# Patient Record
Sex: Male | Born: 1955 | Race: Black or African American | Hispanic: No | Marital: Single | Smoking: Current some day smoker
Health system: Southern US, Community
[De-identification: ages and names within clinical notes are randomized; demographics above are authoritative.]

## PROBLEM LIST (undated history)

## (undated) DIAGNOSIS — E785 Hyperlipidemia, unspecified: Secondary | ICD-10-CM

## (undated) DIAGNOSIS — I10 Essential (primary) hypertension: Secondary | ICD-10-CM

## (undated) DIAGNOSIS — E119 Type 2 diabetes mellitus without complications: Secondary | ICD-10-CM

## (undated) DIAGNOSIS — J45909 Unspecified asthma, uncomplicated: Secondary | ICD-10-CM

## (undated) HISTORY — PX: NECK SURGERY: SHX720

## (undated) HISTORY — DX: Hyperlipidemia, unspecified: E78.5

## (undated) HISTORY — DX: Unspecified asthma, uncomplicated: J45.909

---

## 2010-08-13 ENCOUNTER — Encounter: Payer: Self-pay | Admitting: Emergency Medicine

## 2010-08-13 ENCOUNTER — Ambulatory Visit: Payer: Self-pay | Admitting: Diagnostic Radiology

## 2010-08-14 ENCOUNTER — Inpatient Hospital Stay (HOSPITAL_COMMUNITY): Admission: EM | Admit: 2010-08-14 | Discharge: 2010-08-15 | Payer: Self-pay | Admitting: Family Medicine

## 2010-08-14 ENCOUNTER — Ambulatory Visit: Payer: Self-pay | Admitting: Cardiology

## 2010-08-15 ENCOUNTER — Ambulatory Visit: Payer: Self-pay | Admitting: Vascular Surgery

## 2010-08-15 ENCOUNTER — Encounter (INDEPENDENT_AMBULATORY_CARE_PROVIDER_SITE_OTHER): Payer: Self-pay | Admitting: Internal Medicine

## 2010-08-24 ENCOUNTER — Emergency Department (HOSPITAL_BASED_OUTPATIENT_CLINIC_OR_DEPARTMENT_OTHER): Admission: EM | Admit: 2010-08-24 | Discharge: 2010-08-24 | Payer: Self-pay | Admitting: Emergency Medicine

## 2010-12-30 LAB — CBC
HCT: 39.3 % (ref 39.0–52.0)
Hemoglobin: 13.4 g/dL (ref 13.0–17.0)
MCHC: 34.2 g/dL (ref 30.0–36.0)
MCV: 83.1 fL (ref 78.0–100.0)
Platelets: 206 10*3/uL (ref 150–400)
Platelets: 235 10*3/uL (ref 150–400)
RBC: 4.56 MIL/uL (ref 4.22–5.81)
RBC: 4.85 MIL/uL (ref 4.22–5.81)
RDW: 12.8 % (ref 11.5–15.5)
WBC: 12.5 10*3/uL — ABNORMAL HIGH (ref 4.0–10.5)
WBC: 7.2 10*3/uL (ref 4.0–10.5)

## 2010-12-30 LAB — LIPID PANEL
Cholesterol: 193 mg/dL (ref 0–200)
LDL Cholesterol: 101 mg/dL — ABNORMAL HIGH (ref 0–99)
Triglycerides: 80 mg/dL (ref <150)
VLDL: 16 mg/dL (ref 0–40)

## 2010-12-30 LAB — BASIC METABOLIC PANEL
BUN: 6 mg/dL (ref 6–23)
Creatinine, Ser: 0.92 mg/dL (ref 0.4–1.5)
GFR calc non Af Amer: >60 mL/min (ref >60)

## 2010-12-30 LAB — URINALYSIS, ROUTINE W REFLEX MICROSCOPIC
Protein, ur: NEGATIVE mg/dL
Urobilinogen, UA: 0.2 mg/dL (ref 0.0–1.0)

## 2010-12-30 LAB — COMPREHENSIVE METABOLIC PANEL
ALT: 18 U/L (ref 0–53)
Alkaline Phosphatase: 78 U/L (ref 39–117)
CO2: 26 mEq/L (ref 19–32)
Calcium: 8.9 mg/dL (ref 8.4–10.5)
GFR calc non Af Amer: >60 mL/min (ref >60)
Glucose, Bld: 146 mg/dL — ABNORMAL HIGH (ref 70–99)
Potassium: 4.1 mEq/L (ref 3.5–5.1)
Sodium: 146 mEq/L — ABNORMAL HIGH (ref 135–145)

## 2010-12-30 LAB — GLUCOSE, CAPILLARY
Glucose-Capillary: 131 mg/dL — ABNORMAL HIGH (ref 70–99)
Glucose-Capillary: 132 mg/dL — ABNORMAL HIGH (ref 70–99)
Glucose-Capillary: 136 mg/dL — ABNORMAL HIGH (ref 70–99)
Glucose-Capillary: 141 mg/dL — ABNORMAL HIGH (ref 70–99)
Glucose-Capillary: 155 mg/dL — ABNORMAL HIGH (ref 70–99)
Glucose-Capillary: 157 mg/dL — ABNORMAL HIGH (ref 70–99)
Glucose-Capillary: 82 mg/dL (ref 70–99)

## 2010-12-30 LAB — CARDIAC PANEL(CRET KIN+CKTOT+MB+TROPI)
CK, MB: 1.2 ng/mL (ref 0.3–4.0)
CK, MB: 1.4 ng/mL (ref 0.3–4.0)
Relative Index: 0.4 (ref 0.0–2.5)
Total CK: 275 U/L — ABNORMAL HIGH (ref 7–232)
Total CK: 311 U/L — ABNORMAL HIGH (ref 7–232)
Troponin I: <0.01 ng/mL (ref 0.00–0.06)
Troponin I: <0.01 ng/mL (ref 0.00–0.06)

## 2010-12-30 LAB — DIFFERENTIAL
Basophils Relative: 1 % (ref 0–1)
Eosinophils Absolute: 0.2 10*3/uL (ref 0.0–0.7)
Neutrophils Relative %: 58 % (ref 43–77)

## 2010-12-30 LAB — ETHANOL: Alcohol, Ethyl (B): 128 mg/dL — ABNORMAL HIGH (ref 0–10)

## 2013-10-04 ENCOUNTER — Encounter (HOSPITAL_BASED_OUTPATIENT_CLINIC_OR_DEPARTMENT_OTHER): Payer: Self-pay | Admitting: Emergency Medicine

## 2013-10-04 ENCOUNTER — Emergency Department (HOSPITAL_BASED_OUTPATIENT_CLINIC_OR_DEPARTMENT_OTHER)
Admission: EM | Admit: 2013-10-04 | Discharge: 2013-10-04 | Disposition: A | Discharge status: Home / Self Care | Payer: Self-pay | Attending: Emergency Medicine | Admitting: Emergency Medicine

## 2013-10-04 ENCOUNTER — Emergency Department (HOSPITAL_BASED_OUTPATIENT_CLINIC_OR_DEPARTMENT_OTHER): Payer: Self-pay

## 2013-10-04 DIAGNOSIS — M2559 Pain in other specified joint: Secondary | ICD-10-CM | POA: Insufficient documentation

## 2013-10-04 DIAGNOSIS — J209 Acute bronchitis, unspecified: Secondary | ICD-10-CM | POA: Insufficient documentation

## 2013-10-04 DIAGNOSIS — R630 Anorexia: Secondary | ICD-10-CM | POA: Insufficient documentation

## 2013-10-04 DIAGNOSIS — E119 Type 2 diabetes mellitus without complications: Secondary | ICD-10-CM | POA: Insufficient documentation

## 2013-10-04 DIAGNOSIS — Z794 Long term (current) use of insulin: Secondary | ICD-10-CM | POA: Insufficient documentation

## 2013-10-04 DIAGNOSIS — F17200 Nicotine dependence, unspecified, uncomplicated: Secondary | ICD-10-CM | POA: Insufficient documentation

## 2013-10-04 DIAGNOSIS — IMO0001 Reserved for inherently not codable concepts without codable children: Secondary | ICD-10-CM | POA: Insufficient documentation

## 2013-10-04 DIAGNOSIS — J40 Bronchitis, not specified as acute or chronic: Secondary | ICD-10-CM

## 2013-10-04 DIAGNOSIS — Z79899 Other long term (current) drug therapy: Secondary | ICD-10-CM | POA: Insufficient documentation

## 2013-10-04 DIAGNOSIS — I10 Essential (primary) hypertension: Secondary | ICD-10-CM | POA: Insufficient documentation

## 2013-10-04 DIAGNOSIS — K6289 Other specified diseases of anus and rectum: Secondary | ICD-10-CM | POA: Insufficient documentation

## 2013-10-04 DIAGNOSIS — R5381 Other malaise: Secondary | ICD-10-CM | POA: Insufficient documentation

## 2013-10-04 HISTORY — DX: Type 2 diabetes mellitus without complications: E11.9

## 2013-10-04 HISTORY — DX: Essential (primary) hypertension: I10

## 2013-10-04 LAB — COMPREHENSIVE METABOLIC PANEL
ALT: 12 U/L (ref 0–53)
Albumin: 3.6 g/dL (ref 3.5–5.2)
Alkaline Phosphatase: 67 U/L (ref 39–117)
BUN: 7 mg/dL (ref 6–23)
Chloride: 103 mEq/L (ref 96–112)
GFR calc Af Amer: >90 mL/min (ref >90)
Glucose, Bld: 187 mg/dL — ABNORMAL HIGH (ref 70–99)
Potassium: 3.6 mEq/L (ref 3.5–5.1)
Sodium: 139 mEq/L (ref 135–145)
Total Bilirubin: 0.4 mg/dL (ref 0.3–1.2)
Total Protein: 7.3 g/dL (ref 6.0–8.3)

## 2013-10-04 LAB — CBC WITH DIFFERENTIAL/PLATELET
Eosinophils Absolute: 0.1 10*3/uL (ref 0.0–0.7)
Hemoglobin: 12.7 g/dL — ABNORMAL LOW (ref 13.0–17.0)
Lymphs Abs: 1 10*3/uL (ref 0.7–4.0)
MCH: 28.7 pg (ref 26.0–34.0)
Monocytes Relative: 10 % (ref 3–12)
Neutro Abs: 4.4 10*3/uL (ref 1.7–7.7)
Neutrophils Relative %: 71 % (ref 43–77)
Platelets: 200 10*3/uL (ref 150–400)
RBC: 4.42 MIL/uL (ref 4.22–5.81)
WBC: 6.2 10*3/uL (ref 4.0–10.5)

## 2013-10-04 LAB — OCCULT BLOOD X 1 CARD TO LAB, STOOL: Fecal Occult Bld: NEGATIVE

## 2013-10-04 LAB — TROPONIN I: Troponin I: <0.3 ng/mL (ref <0.30)

## 2013-10-04 LAB — RAPID STREP SCREEN (MED CTR MEBANE ONLY): Streptococcus, Group A Screen (Direct): NEGATIVE

## 2013-10-04 MED ORDER — AZITHROMYCIN 250 MG PO TABS: 250.0000 mg | ORAL_TABLET | Freq: Every day | ORAL | Status: DC

## 2013-10-04 MED ORDER — ALBUTEROL SULFATE HFA 108 (90 BASE) MCG/ACT IN AERS: 1.0000 | INHALATION_SPRAY | Freq: Four times a day (QID) | RESPIRATORY_TRACT | Status: AC | PRN

## 2013-10-04 NOTE — ED Notes (Signed)
Pt. Ambulated to rest room with no distress and no trouble.

## 2013-10-04 NOTE — ED Notes (Signed)
Pt amb to triage with quick steady gait in nad. Pt reports cough, rectal pain when he coughs, and congestion x Sunday.

## 2013-10-04 NOTE — ED Provider Notes (Signed)
CSN: [id number]     Arrival date & time 10/04/13  1723 History   First MD Initiated Contact with Patient 10/04/13 1752     Chief Complaint  Patient presents with  . Sore Throat  . Rectal Pain  . Cough   (Consider location/radiation/quality/duration/timing/severity/associated sxs/prior Treatment) HPI Comments: Patient claims a four-day history of sore throat, productive cough, shortness of breath, pain in his rectum with coughing. Coughing productive of yellow-green mucus. He is a smoker. He is a diabetic and has a history of hypertension. Denies any cardiac or pulmonary problems. He did receive a flu shot. Denies any headache, chest pain, abdominal pain or back pain. No vomiting. He has had sick contacts at home. Denies any blood in his stool.  The history is provided by the patient.    Past Medical History  Diagnosis Date  . Diabetes mellitus without complication   . Hypertension    History reviewed. No pertinent past surgical history. History reviewed. No pertinent family history. History  Substance Use Topics  . Smoking status: Current Every Day Smoker  . Smokeless tobacco: Not on file  . Alcohol Use: Not on file    Review of Systems  Constitutional: Positive for activity change, appetite change and fatigue. Negative for fever.  HENT: Positive for congestion and rhinorrhea.   Respiratory: Positive for cough and shortness of breath. Negative for chest tightness.   Cardiovascular: Negative for chest pain.  Gastrointestinal: Negative for nausea, vomiting and abdominal pain.  Genitourinary: Negative for dysuria and hematuria.  Musculoskeletal: Positive for arthralgias and myalgias. Negative for back pain.  Skin: Negative for rash.  Neurological: Negative for dizziness, weakness and headaches.  A complete 10 system review of systems was obtained and all systems are negative except as noted in the HPI and PMH.    Allergies  Sulfa antibiotics  Home Medications   Current  Outpatient Rx  Name  Route  Sig  Dispense  Refill  . ALBUTEROL IN   Inhalation   Inhale into the lungs.         Marland Kitchen GABAPENTIN, PHN, PO   Oral   Take by mouth.         . insulin glargine (LANTUS) 100 UNIT/ML injection   Subcutaneous   Inject 80 Units into the skin at bedtime.         Marland Kitchen omeprazole (PRILOSEC) 20 MG capsule   Oral   Take 20 mg by mouth daily.         . Rosuvastatin Calcium (CRESTOR PO)   Oral   Take by mouth.         . sertraline (ZOLOFT) 50 MG tablet   Oral   Take 50 mg by mouth daily.         . tamsulosin (FLOMAX) 0.4 MG CAPS capsule   Oral   Take 0.4 mg by mouth.         . TRAZODONE HCL PO   Oral   Take by mouth.         Marland Kitchen albuterol (PROVENTIL HFA;VENTOLIN HFA) 108 (90 BASE) MCG/ACT inhaler   Inhalation   Inhale 1-2 puffs into the lungs every 6 (six) hours as needed for wheezing or shortness of breath.   1 Inhaler   0   . azithromycin (ZITHROMAX) 250 MG tablet   Oral   Take 1 tablet (250 mg total) by mouth daily. Take first 2 tablets together, then 1 every day until finished.   6 tablet   0  BP 131/76  Pulse 90  Temp(Src) 98.4 F (36.9 C) (Oral)  Resp 18  SpO2 100% Physical Exam  Constitutional: He is oriented to person, place, and time. He appears well-developed and well-nourished. No distress.  HENT:  Head: Normocephalic and atraumatic.  Mouth/Throat: Oropharynx is clear and moist. No oropharyngeal exudate.  Eyes: Conjunctivae and EOM are normal. Pupils are equal, round, and reactive to light.  Neck: Normal range of motion. Neck supple.  No meningismus Well healed surgical scar  Cardiovascular: Normal rate, regular rhythm and normal heart sounds.   No murmur heard. Pulmonary/Chest: Effort normal and breath sounds normal. No respiratory distress.  Abdominal: Soft. There is no tenderness. There is no rebound and no guarding.  Genitourinary: Guaiac negative stool.  Musculoskeletal: Normal range of motion. He exhibits  no edema and no tenderness.  Neurological: He is alert and oriented to person, place, and time. No cranial nerve deficit. He exhibits normal muscle tone. Coordination normal.  Skin: Skin is warm.    ED Course  Procedures (including critical care time) Labs Review Labs Reviewed  CBC WITH DIFFERENTIAL - Abnormal; Notable for the following:    Hemoglobin 12.7 (*)    HCT 38.0 (*)    All other components within normal limits  COMPREHENSIVE METABOLIC PANEL - Abnormal; Notable for the following:    Glucose, Bld 187 (*)    All other components within normal limits  RAPID STREP SCREEN  CULTURE, GROUP A STREP  TROPONIN I  PRO B NATRIURETIC PEPTIDE  OCCULT BLOOD X 1 CARD TO LAB, STOOL   Imaging Review Dg Chest 2 View  10/04/2013   CLINICAL DATA:  Cough.  Shortness of breath.  Chest congestion.  EXAM: CHEST  2 VIEW  COMPARISON:  04/20/2011  FINDINGS: The heart size and mediastinal contours are within normal limits. Both lungs are clear. The visualized skeletal structures are unremarkable.  IMPRESSION: No active cardiopulmonary disease.   Electronically Signed   By: Myles Rosenthal M.D.   On: 10/04/2013 18:22   Dg Abd 1 View  10/04/2013   CLINICAL DATA:  Rectal pain when coughing.  EXAM: ABDOMEN - 1 VIEW  COMPARISON:  None.  FINDINGS: There are no dilated loops of large or small bowel. Phleboliths are seen in the pelvis. No osseous abnormality.  IMPRESSION: Benign appearing abdomen.   Electronically Signed   By: Geanie Cooley M.D.   On: 10/04/2013 18:20    EKG Interpretation    Date/Time:  Thursday October 04 2013 18:23:00 EST Ventricular Rate:  90 PR Interval:  162 QRS Duration: 90 QT Interval:  366 QTC Calculation: 447 R Axis:   36 Text Interpretation:  Normal sinus rhythm Normal ECG No significant change was found Confirmed by Manus Gunning  MD, Laretta Pyatt ([id number]) on 10/04/2013 6:28:31 PM            MDM   1. Bronchitis    3 day history of productive cough and congestion. No chest pain  or shortness of breath.  Abdomen is soft without peritoneal signs.  Hemoglobin stable. Guaiac negative.  X-rays negative for acute infiltrate. Patient in no distress with no hypoxia or tachycardia. Patient instructed that he needs to stop smoking. Given patient's history of smoking and diabetes, we'll treat for bronchitis. He is not wheezing on exam. He is stable for followup with his PCP.  Glynn Octave, MD 10/04/13 [phone]

## 2013-10-04 NOTE — ED Notes (Signed)
Pt. Goes on and on  With different complaints and different things that have happened in the past.

## 2013-10-06 LAB — CULTURE, GROUP A STREP

## 2014-05-08 ENCOUNTER — Institutional Professional Consult (permissible substitution): Payer: Self-pay | Admitting: Cardiology

## 2014-07-31 ENCOUNTER — Institutional Professional Consult (permissible substitution): Payer: Self-pay | Admitting: Cardiology

## 2014-08-21 ENCOUNTER — Encounter: Payer: Self-pay | Admitting: Cardiology

## 2014-08-21 NOTE — Progress Notes (Signed)
     HPI: 58 year old male for evaluation of syncope. Echocardiogram in October 2011 showed normal LV function, mild left atrial enlargement and mildly dilated aortic root. Had a syncopal episode in October 2011 attributed to alcohol.  Current Outpatient Prescriptions  Medication Sig Dispense Refill  . albuterol (PROVENTIL HFA;VENTOLIN HFA) 108 (90 BASE) MCG/ACT inhaler Inhale 1-2 puffs into the lungs every 6 (six) hours as needed for wheezing or shortness of breath. 1 Inhaler 0  . ALBUTEROL IN Inhale into the lungs.    Marland Kitchen. azithromycin (ZITHROMAX) 250 MG tablet Take 1 tablet (250 mg total) by mouth daily. Take first 2 tablets together, then 1 every day until finished. 6 tablet 0  . GABAPENTIN, PHN, PO Take by mouth.    . insulin glargine (LANTUS) 100 UNIT/ML injection Inject 80 Units into the skin at bedtime.    Marland Kitchen. omeprazole (PRILOSEC) 20 MG capsule Take 20 mg by mouth daily.    . Rosuvastatin Calcium (CRESTOR PO) Take by mouth.    . sertraline (ZOLOFT) 50 MG tablet Take 50 mg by mouth daily.    . tamsulosin (FLOMAX) 0.4 MG CAPS capsule Take 0.4 mg by mouth.    . TRAZODONE HCL PO Take by mouth.     No current facility-administered medications for this visit.    Allergies  Allergen Reactions  . Sulfa Antibiotics     Past Medical History  Diagnosis Date  . Diabetes mellitus without complication   . Hypertension     No past surgical history on file.  History   Social History  . Marital Status: Single    Spouse Name: N/A    Number of Children: N/A  . Years of Education: N/A   Occupational History  . Not on file.   Social History Main Topics  . Smoking status: Current Every Day Smoker  . Smokeless tobacco: Not on file  . Alcohol Use: Not on file  . Drug Use: Not on file  . Sexual Activity: Not on file   Other Topics Concern  . Not on file   Social History Narrative  . No narrative on file    No family history on file.  ROS: no fevers or chills, productive cough,  hemoptysis, dysphasia, odynophagia, melena, hematochezia, dysuria, hematuria, rash, seizure activity, orthopnea, PND, pedal edema, claudication. Remaining systems are negative.  Physical Exam:   There were no vitals taken for this visit.  General:  Well developed/well nourished in NAD Skin warm/dry Patient not depressed No peripheral clubbing Back-normal HEENT-normal/normal eyelids Neck supple/normal carotid upstroke bilaterally; no bruits; no JVD; no thyromegaly chest - CTA/ normal expansion CV - RRR/normal S1 and S2; no murmurs, rubs or gallops;  PMI nondisplaced Abdomen -NT/ND, no HSM, no mass, + bowel sounds, no bruit 2+ femoral pulses, no bruits Ext-no edema, chords, 2+ DP Neuro-grossly nonfocal  ECG    This encounter was created in error - please disregard.

## 2014-11-05 ENCOUNTER — Telehealth: Payer: Self-pay | Admitting: Cardiology

## 2014-11-05 NOTE — Telephone Encounter (Signed)
Closed encounter °

## 2014-11-13 ENCOUNTER — Ambulatory Visit (INDEPENDENT_AMBULATORY_CARE_PROVIDER_SITE_OTHER): Payer: Self-pay | Admitting: Cardiology

## 2014-11-13 ENCOUNTER — Encounter: Payer: Self-pay | Admitting: Cardiology

## 2014-11-13 ENCOUNTER — Encounter: Payer: Self-pay | Admitting: *Deleted

## 2014-11-13 VITALS — BP 112/72 | HR 84 | Ht 77.0 in | Wt 254.1 lb

## 2014-11-13 DIAGNOSIS — Z720 Tobacco use: Secondary | ICD-10-CM

## 2014-11-13 DIAGNOSIS — R079 Chest pain, unspecified: Secondary | ICD-10-CM | POA: Insufficient documentation

## 2014-11-13 DIAGNOSIS — I10 Essential (primary) hypertension: Secondary | ICD-10-CM

## 2014-11-13 DIAGNOSIS — R072 Precordial pain: Secondary | ICD-10-CM

## 2014-11-13 DIAGNOSIS — R55 Syncope and collapse: Secondary | ICD-10-CM

## 2014-11-13 NOTE — Assessment & Plan Note (Signed)
Patient counseled on discontinuing. 

## 2014-11-13 NOTE — Assessment & Plan Note (Signed)
Etiology unclear. He does have some orthostatic symptoms although event not clearly orthostatic by history. He was seen at [facility] and records are not available. Electrocardiogram is normal. No recurrent events in the past 1 year. Will arrange echocardiogram to assess LV function and if normal no further workup unless has recurrent events.

## 2014-11-13 NOTE — Assessment & Plan Note (Signed)
Symptoms are atypical. However given long history of diabetes mellitus I think it is worthwhile to proceed with an exercise treadmill for risk stratification.

## 2014-11-13 NOTE — Patient Instructions (Signed)
Your physician recommends that you schedule a follow-up appointment in: AS NEEDED PENDING TEST RESULTS  Your physician has requested that you have an echocardiogram. Echocardiography is a painless test that uses sound waves to create images of your heart. It provides your doctor with information about the size and shape of your heart and how well your heart's chambers and valves are working. This procedure takes approximately one hour. There are no restrictions for this procedure.   Your physician has requested that you have an exercise tolerance test. For further information please visit [email or web address]. Please also follow instruction sheet, as given.    Exercise Stress Electrocardiogram An exercise stress electrocardiogram is a test to check how blood flows to your heart. It is done to find areas of poor blood flow. You will need to walk on a treadmill for this test. The electrocardiogram will record your heartbeat when you are at rest and when you are exercising. BEFORE THE PROCEDURE  Do not have drinks with caffeine or foods with caffeine for 24 hours before the test, or as told by your doctor. This includes coffee, tea (even decaf tea), sodas, chocolate, and cocoa.  Follow your doctor's instructions about eating and drinking before the test.  Ask your doctor what medicines you should or should not take before the test. Take your medicines with water unless told by your doctor not to.  If you use an inhaler, bring it with you to the test.  Bring a snack to eat after the test.  Do not  smoke for 4 hours before the test.  Do not put lotions, powders, creams, or oils on your chest before the test.  Wear comfortable shoes and clothing. PROCEDURE  You will have patches put on your chest. Small areas of your chest may need to be shaved. Wires will be connected to the patches.  Your heart rate will be watched while you are resting and while you are exercising.  You will walk on the  treadmill. The treadmill will slowly get faster to raise your heart rate.  The test will take about 1-2 hours. AFTER THE PROCEDURE  Your heart rate and blood pressure will be watched after the test.  You may return to your normal diet, activities, and medicines or as told by your doctor. Document Released: 03/22/2008 Document Revised: 02/18/2014 Document Reviewed: 06/11/2013 [employer] Patient Information 2015 [employer]. This information is not intended to replace advice given to you by your health care provider. Make sure you discuss any questions you have with your health care provider.   

## 2014-11-13 NOTE — Progress Notes (Signed)
HPI: 59 year old male for evaluation of syncope. Echocardiogram February 2015 showed normal LV function and mild left ventricular hypertrophy. Patient had a syncopal episode approximately 1 year ago. He took his medicines and then was unconscious for 3 minutes. He apparently was seen at [facility] but I do not have those records available. No preceding chest pain or palpitations. He does note some dyspnea on exertion but no orthopnea or PND. He describes occasional pedal edema. He also has occasional chest pain that is substernal without radiation. It increases with cough and inspiration and lying flat. No exertional symptoms. Typically last 3-4 minutes.  Current Outpatient Prescriptions  Medication Sig Dispense Refill  . albuterol (PROVENTIL HFA;VENTOLIN HFA) 108 (90 BASE) MCG/ACT inhaler Inhale 1-2 puffs into the lungs every 6 (six) hours as needed for wheezing or shortness of breath. 1 Inhaler 0  . ASPIR-LOW 81 MG EC tablet Take 81 mg by mouth daily.  3  . CRESTOR 20 MG tablet Take 20 mg by mouth daily.  5  . esomeprazole (NEXIUM) 40 MG capsule Take 40 mg by mouth daily at 12 noon.    . gabapentin (NEURONTIN) 300 MG capsule Take 600 mg by mouth 2 (two) times daily.  3  . glipiZIDE (GLUCOTROL) 5 MG tablet Take 5 mg by mouth daily before breakfast.    . insulin glargine (LANTUS) 100 UNIT/ML injection Inject 80 Units into the skin at bedtime.    Marland Kitchen. lisinopril (PRINIVIL,ZESTRIL) 2.5 MG tablet Take 2.5 mg by mouth daily.    . metFORMIN (GLUCOPHAGE) 1000 MG tablet Take 1,000 mg by mouth 2 (two) times daily with a meal.   5  . sertraline (ZOLOFT) 50 MG tablet Take 100 mg by mouth daily.     . silodosin (RAPAFLO) 8 MG CAPS capsule Take 8 mg by mouth daily with breakfast.    . tamsulosin (FLOMAX) 0.4 MG CAPS capsule Take 0.4 mg by mouth.    . traZODone (DESYREL) 50 MG tablet Take 50 mg by mouth at bedtime.  2   No current facility-administered medications for this visit.    Allergies    Allergen Reactions  . Sulfa Antibiotics      Past Medical History  Diagnosis Date  . Diabetes mellitus without complication   . Hypertension   . Hyperlipidemia   . Asthma     Past Surgical History  Procedure Laterality Date  . Neck surgery      History   Social History  . Marital Status: Single    Spouse Name: N/A    Number of Children: 1  . Years of Education: N/A   Occupational History  . Not on file.   Social History Main Topics  . Smoking status: Current Some Day Smoker  . Smokeless tobacco: Not on file  . Alcohol Use: 0.0 oz/week    0 Not specified per week     Comment: Occasional  . Drug Use: No  . Sexual Activity: Not on file   Other Topics Concern  . Not on file   Social History Narrative    Family History  Problem Relation Age of Onset  . Heart disease      No family history    ROS: some subjective fever but no productive cough, hemoptysis, dysphasia, odynophagia, melena, hematochezia, dysuria, hematuria, rash, seizure activity, orthopnea, PND, claudication. Remaining systems are negative.  Physical Exam:   Blood pressure 112/72, pulse 84, height 6\' 5"  (1.956 m), weight 254 lb 1.3 oz (115.25  kg).  General:  Well developed/well nourished in NAD Skin warm/dry Patient not depressed No peripheral clubbing Back-normal HEENT-normal/normal eyelids Neck supple/normal carotid upstroke bilaterally; no bruits; no JVD; no thyromegaly chest - CTA/ normal expansion CV - RRR/normal S1 and S2; no murmurs, rubs or gallops;  PMI nondisplaced Abdomen -NT/ND, no HSM, no mass, + bowel sounds, no bruit 2+ femoral pulses, no bruits Ext-no edema, chords, 2+ DP Neuro-grossly nonfocal  ECG NSR with no ST changes

## 2014-11-13 NOTE — Assessment & Plan Note (Signed)
Blood pressure controlled. Continue present medications. 

## 2014-11-29 ENCOUNTER — Telehealth (HOSPITAL_COMMUNITY): Payer: Self-pay

## 2014-11-29 NOTE — Telephone Encounter (Signed)
Encounter complete. 

## 2014-12-02 ENCOUNTER — Telehealth (HOSPITAL_COMMUNITY): Payer: Self-pay | Admitting: *Deleted

## 2014-12-04 ENCOUNTER — Encounter (HOSPITAL_COMMUNITY): Payer: Self-pay

## 2014-12-04 ENCOUNTER — Inpatient Hospital Stay (HOSPITAL_COMMUNITY): Admission: RE | Admit: 2014-12-04 | Payer: Self-pay | Source: Ambulatory Visit

## 2015-06-01 IMAGING — CR DG ABDOMEN 1V
2 series · 2 of 2 positions shown · non-contrast
Comparison: None.

CLINICAL DATA: Rectal pain when coughing.

EXAM:
ABDOMEN - 1 VIEW

[t abdomen supine (1 of 2)]
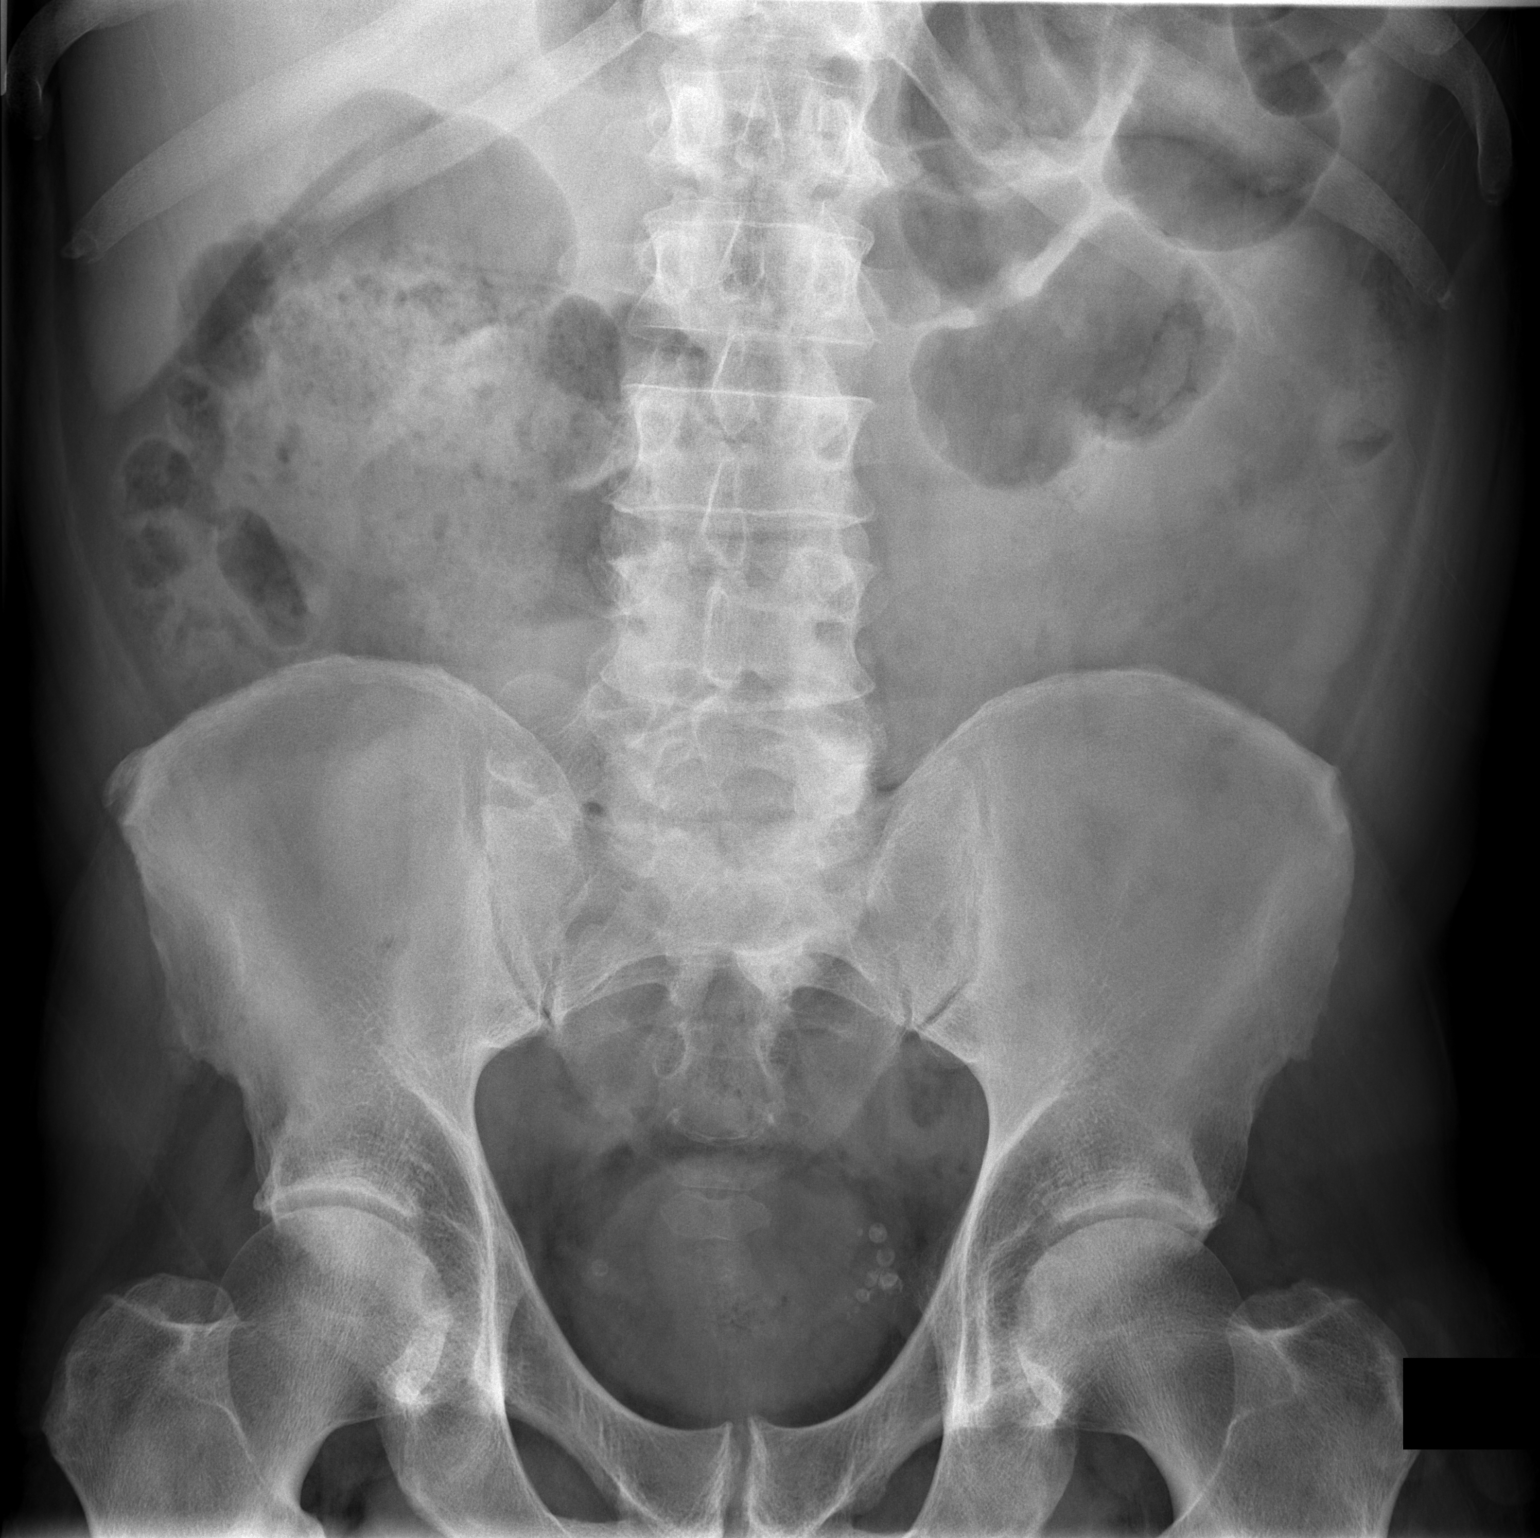

[t abdomen supine (2 of 2)]
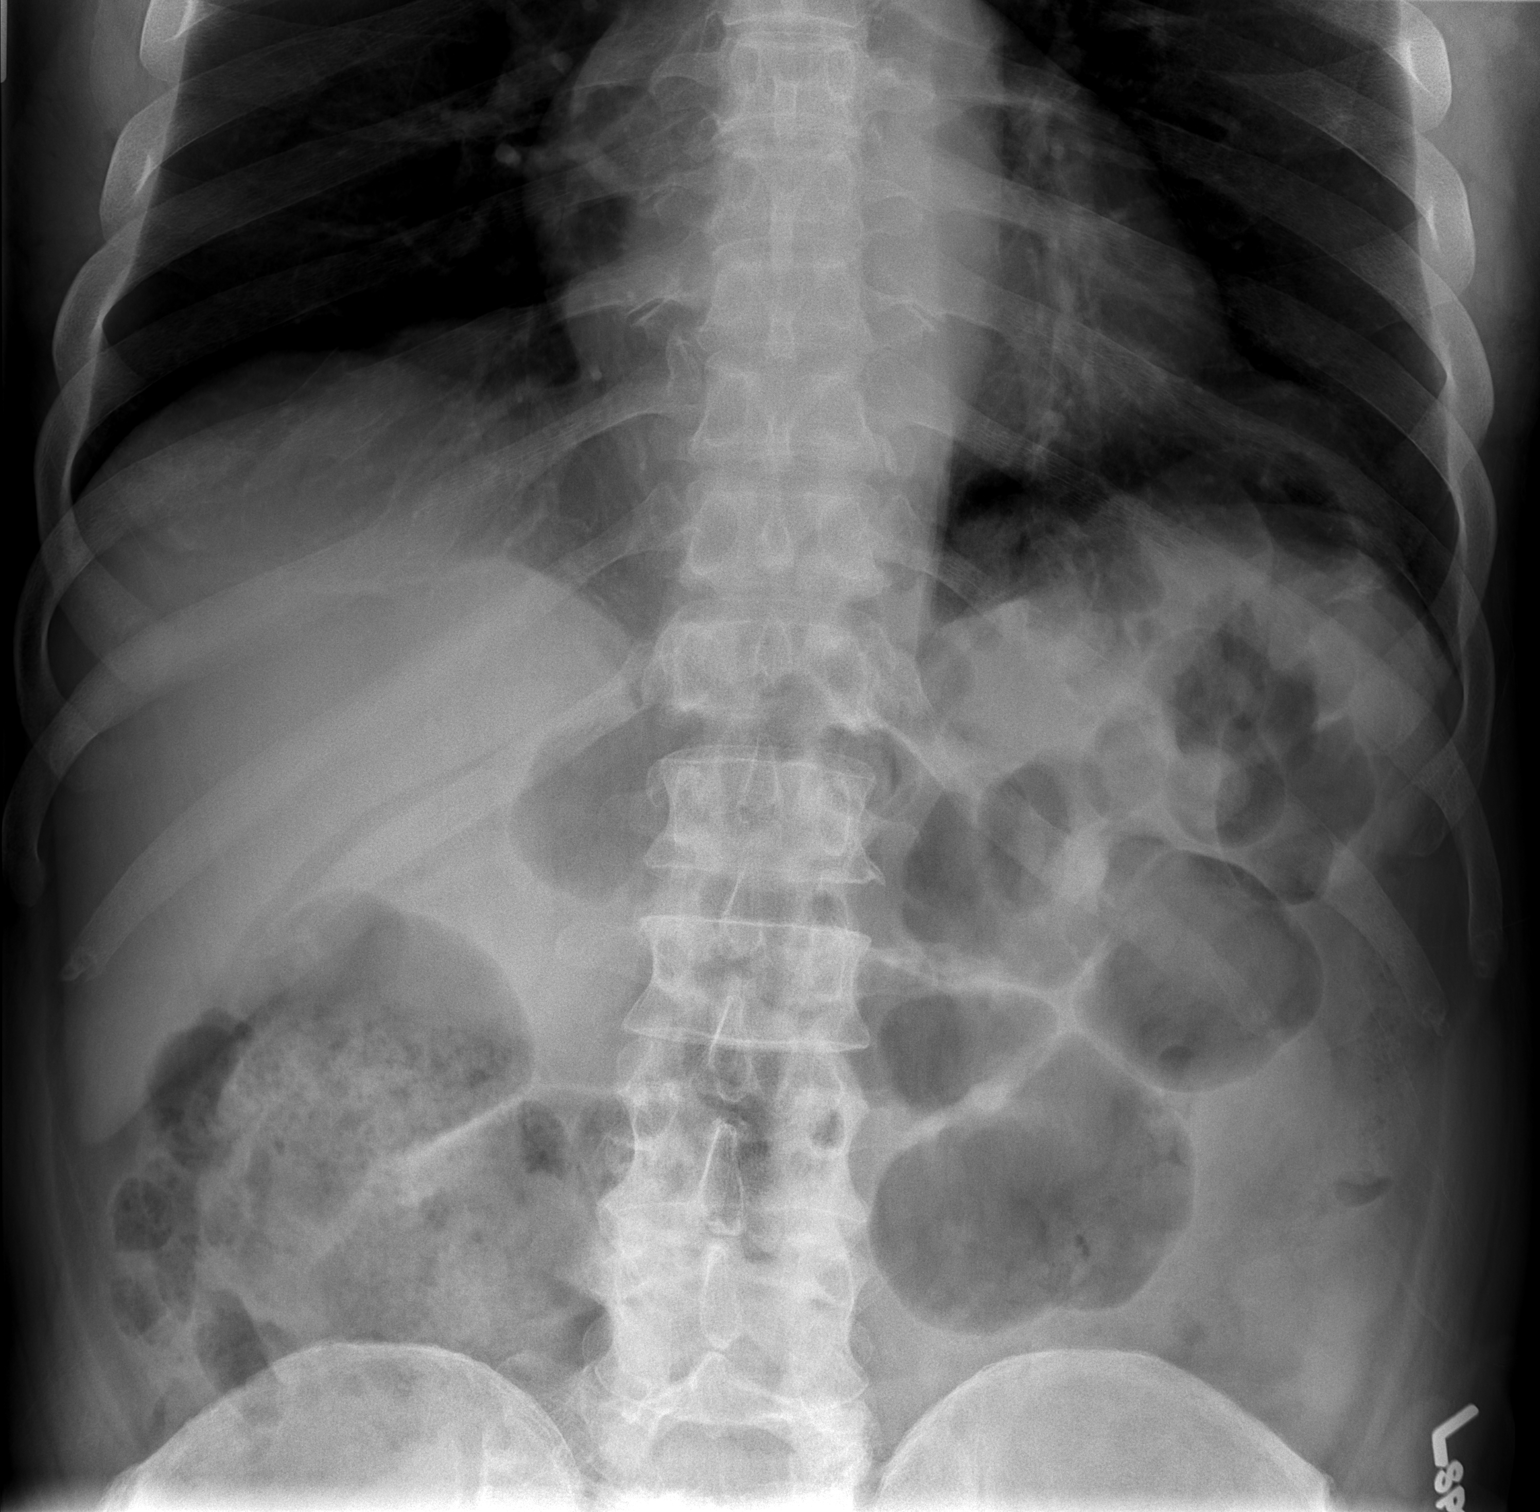

[2 of 2 positions shown; findings below may reference images not displayed]

FINDINGS: There are no dilated loops of large or small bowel. Phleboliths are
seen in the pelvis. No osseous abnormality.
IMPRESSION: Benign appearing abdomen.

## 2015-06-01 IMAGING — CR DG CHEST 2V
2 series · 2 of 2 positions shown · non-contrast
Comparison: 04/20/2011

CLINICAL DATA: Cough.  Shortness of breath.  Chest congestion.

EXAM:
CHEST  2 VIEW

[w chest pa]
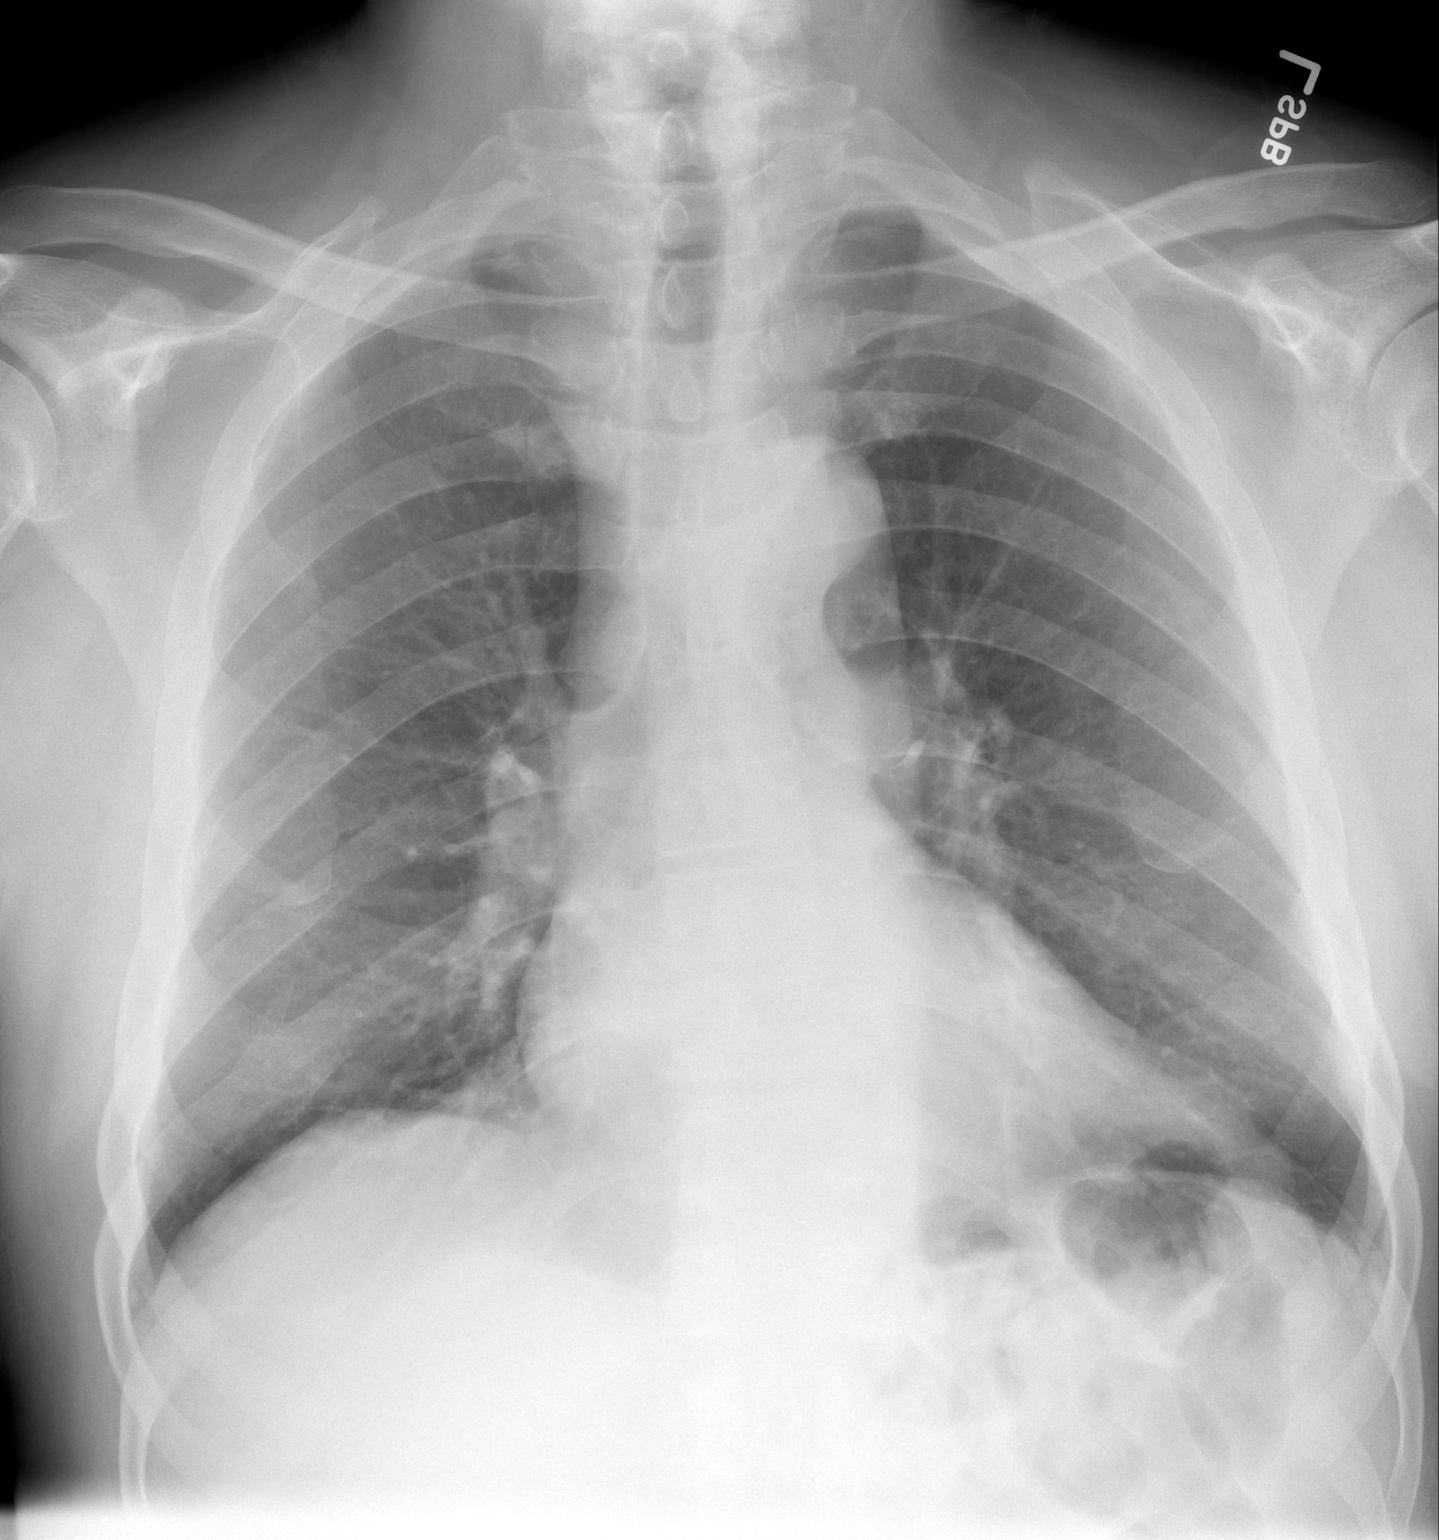

[w chest lat]
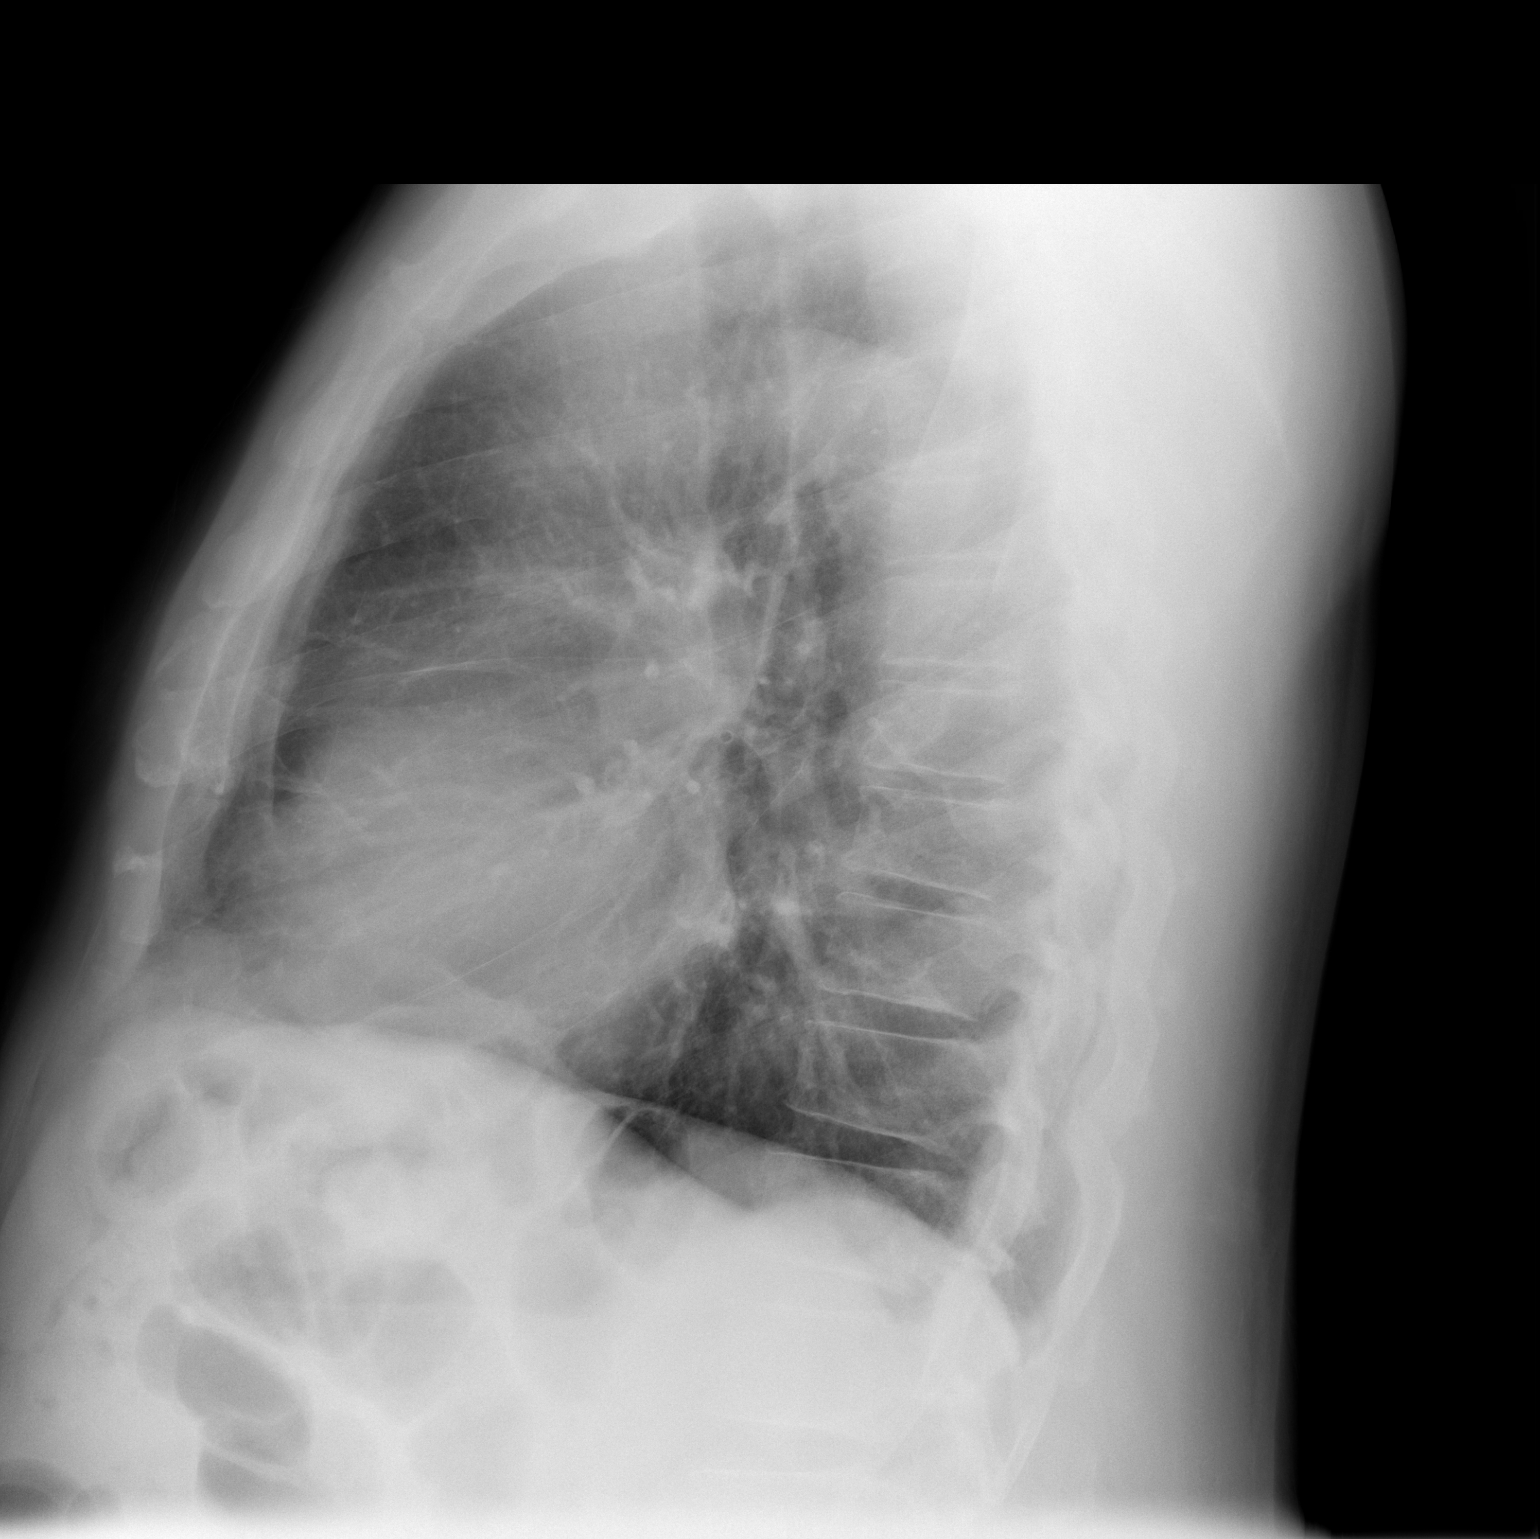

[2 of 2 positions shown; findings below may reference images not displayed]

FINDINGS: The heart size and mediastinal contours are within normal limits.
Both lungs are clear. The visualized skeletal structures are
unremarkable.
IMPRESSION: No active cardiopulmonary disease.

## 2015-09-22 ENCOUNTER — Ambulatory Visit: Payer: Self-pay
